# Patient Record
Sex: Female | Born: 1950 | Race: Asian | Hispanic: No | Marital: Married | Smoking: Never smoker
Health system: Southern US, Community
[De-identification: ages and names within clinical notes are randomized; demographics above are authoritative.]

## PROBLEM LIST (undated history)

## (undated) DIAGNOSIS — E785 Hyperlipidemia, unspecified: Secondary | ICD-10-CM

## (undated) DIAGNOSIS — I10 Essential (primary) hypertension: Secondary | ICD-10-CM

## (undated) DIAGNOSIS — T300 Burn of unspecified body region, unspecified degree: Secondary | ICD-10-CM

## (undated) DIAGNOSIS — K625 Hemorrhage of anus and rectum: Secondary | ICD-10-CM

## (undated) DIAGNOSIS — A048 Other specified bacterial intestinal infections: Secondary | ICD-10-CM

## (undated) HISTORY — PX: HAND SURGERY: SHX662

---

## 2002-02-17 ENCOUNTER — Encounter: Payer: Self-pay | Admitting: Emergency Medicine

## 2002-02-17 ENCOUNTER — Emergency Department (HOSPITAL_COMMUNITY): Admission: EM | Admit: 2002-02-17 | Discharge: 2002-02-17 | Payer: Self-pay

## 2002-03-22 ENCOUNTER — Emergency Department (HOSPITAL_COMMUNITY): Admission: EM | Admit: 2002-03-22 | Discharge: 2002-03-23 | Payer: Self-pay | Admitting: Emergency Medicine

## 2002-03-23 ENCOUNTER — Encounter: Payer: Self-pay | Admitting: Emergency Medicine

## 2004-11-29 ENCOUNTER — Emergency Department (HOSPITAL_COMMUNITY): Admission: EM | Admit: 2004-11-29 | Discharge: 2004-11-29 | Payer: Self-pay | Admitting: Emergency Medicine

## 2008-11-10 ENCOUNTER — Ambulatory Visit: Payer: Self-pay | Admitting: Diagnostic Radiology

## 2008-11-10 ENCOUNTER — Emergency Department (HOSPITAL_BASED_OUTPATIENT_CLINIC_OR_DEPARTMENT_OTHER): Admission: EM | Admit: 2008-11-10 | Discharge: 2008-11-10 | Payer: Self-pay | Admitting: Emergency Medicine

## 2014-08-11 ENCOUNTER — Emergency Department (HOSPITAL_COMMUNITY): Payer: Self-pay

## 2014-08-11 ENCOUNTER — Emergency Department (HOSPITAL_COMMUNITY)
Admission: EM | Admit: 2014-08-11 | Discharge: 2014-08-11 | Disposition: A | Discharge status: Home / Self Care | Payer: Self-pay | Attending: Emergency Medicine | Admitting: Emergency Medicine

## 2014-08-11 ENCOUNTER — Encounter (HOSPITAL_COMMUNITY): Payer: Self-pay | Admitting: Emergency Medicine

## 2014-08-11 DIAGNOSIS — K8020 Calculus of gallbladder without cholecystitis without obstruction: Secondary | ICD-10-CM | POA: Insufficient documentation

## 2014-08-11 DIAGNOSIS — R1013 Epigastric pain: Secondary | ICD-10-CM | POA: Insufficient documentation

## 2014-08-11 DIAGNOSIS — Z8619 Personal history of other infectious and parasitic diseases: Secondary | ICD-10-CM | POA: Insufficient documentation

## 2014-08-11 DIAGNOSIS — Z8639 Personal history of other endocrine, nutritional and metabolic disease: Secondary | ICD-10-CM | POA: Insufficient documentation

## 2014-08-11 DIAGNOSIS — Z79899 Other long term (current) drug therapy: Secondary | ICD-10-CM | POA: Insufficient documentation

## 2014-08-11 DIAGNOSIS — R6883 Chills (without fever): Secondary | ICD-10-CM | POA: Insufficient documentation

## 2014-08-11 DIAGNOSIS — R112 Nausea with vomiting, unspecified: Secondary | ICD-10-CM | POA: Insufficient documentation

## 2014-08-11 DIAGNOSIS — Z880 Allergy status to penicillin: Secondary | ICD-10-CM | POA: Insufficient documentation

## 2014-08-11 DIAGNOSIS — Z862 Personal history of diseases of the blood and blood-forming organs and certain disorders involving the immune mechanism: Secondary | ICD-10-CM | POA: Insufficient documentation

## 2014-08-11 HISTORY — DX: Hyperlipidemia, unspecified: E78.5

## 2014-08-11 HISTORY — DX: Essential (primary) hypertension: I10

## 2014-08-11 HISTORY — DX: Burn of unspecified body region, unspecified degree: T30.0

## 2014-08-11 HISTORY — DX: Hemorrhage of anus and rectum: K62.5

## 2014-08-11 HISTORY — DX: Other specified bacterial intestinal infections: A04.8

## 2014-08-11 LAB — URINALYSIS, ROUTINE W REFLEX MICROSCOPIC
BILIRUBIN URINE: NEGATIVE
Glucose, UA: NEGATIVE mg/dL
Hgb urine dipstick: NEGATIVE
Ketones, ur: NEGATIVE mg/dL
LEUKOCYTES UA: NEGATIVE
NITRITE: NEGATIVE
PH: 8 (ref 5.0–8.0)
Protein, ur: NEGATIVE mg/dL
SPECIFIC GRAVITY, URINE: 1.011 (ref 1.005–1.030)
UROBILINOGEN UA: 0.2 mg/dL (ref 0.0–1.0)

## 2014-08-11 LAB — LIPASE, BLOOD: LIPASE: 49 U/L (ref 11–59)

## 2014-08-11 LAB — CBC
HCT: 41.5 % (ref 36.0–46.0)
Hemoglobin: 14.5 g/dL (ref 12.0–15.0)
MCH: 31 pg (ref 26.0–34.0)
MCHC: 34.9 g/dL (ref 30.0–36.0)
MCV: 88.7 fL (ref 78.0–100.0)
Platelets: 251 10*3/uL (ref 150–400)
RBC: 4.68 MIL/uL (ref 3.87–5.11)
RDW: 12.6 % (ref 11.5–15.5)
WBC: 7.4 10*3/uL (ref 4.0–10.5)

## 2014-08-11 LAB — COMPREHENSIVE METABOLIC PANEL
ALBUMIN: 4.3 g/dL (ref 3.5–5.2)
ALK PHOS: 72 U/L (ref 39–117)
ALT: 18 U/L (ref 0–35)
ANION GAP: 17 — AB (ref 5–15)
AST: 17 U/L (ref 0–37)
BUN: 11 mg/dL (ref 6–23)
CALCIUM: 9.6 mg/dL (ref 8.4–10.5)
CO2: 24 mEq/L (ref 19–32)
Chloride: 96 mEq/L (ref 96–112)
Creatinine, Ser: 0.87 mg/dL (ref 0.50–1.10)
GFR calc Af Amer: 80 mL/min — ABNORMAL LOW (ref >90)
GFR calc non Af Amer: 69 mL/min — ABNORMAL LOW (ref >90)
Glucose, Bld: 108 mg/dL — ABNORMAL HIGH (ref 70–99)
POTASSIUM: 3.7 meq/L (ref 3.7–5.3)
SODIUM: 137 meq/L (ref 137–147)
TOTAL PROTEIN: 8.2 g/dL (ref 6.0–8.3)
Total Bilirubin: 0.4 mg/dL (ref 0.3–1.2)

## 2014-08-11 LAB — I-STAT TROPONIN, ED: TROPONIN I, POC: 0 ng/mL (ref 0.00–0.08)

## 2014-08-11 MED ORDER — ONDANSETRON HCL 4 MG/2ML IJ SOLN
4.0000 mg | Freq: Once | INTRAMUSCULAR | Status: AC
  Administered 2014-08-11: 4 mg via INTRAVENOUS
  Filled 2014-08-11: qty 2

## 2014-08-11 MED ORDER — HYDROCODONE-ACETAMINOPHEN 5-325 MG PO TABS: 1.0000 | ORAL_TABLET | Freq: Four times a day (QID) | ORAL | Status: DC | PRN

## 2014-08-11 MED ORDER — MORPHINE SULFATE 4 MG/ML IJ SOLN
4.0000 mg | Freq: Once | INTRAMUSCULAR | Status: AC
  Administered 2014-08-11: 4 mg via INTRAVENOUS
  Filled 2014-08-11: qty 1

## 2014-08-11 MED ORDER — IOHEXOL 300 MG/ML  SOLN
50.0000 mL | Freq: Once | INTRAMUSCULAR | Status: AC | PRN
  Administered 2014-08-11: 50 mL via ORAL

## 2014-08-11 MED ORDER — SODIUM CHLORIDE 0.9 % IV BOLUS (SEPSIS)
1000.0000 mL | Freq: Once | INTRAVENOUS | Status: AC
  Administered 2014-08-11: 1000 mL via INTRAVENOUS

## 2014-08-11 MED ORDER — IOHEXOL 300 MG/ML  SOLN
100.0000 mL | Freq: Once | INTRAMUSCULAR | Status: AC | PRN
  Administered 2014-08-11: 100 mL via INTRAVENOUS

## 2014-08-11 NOTE — ED Notes (Signed)
Bed: WA04 Expected date:  Expected time:  Means of arrival:  Comments: EMS 64yo F abd pain x 2 days / no [place]

## 2014-08-11 NOTE — ED Provider Notes (Signed)
CSN: [id number]     Arrival date & time 08/11/14  0607 History   First MD Initiated Contact with Patient 08/11/14 0701     Chief Complaint  Patient presents with  . Abdominal Pain     (Consider location/radiation/quality/duration/timing/severity/associated sxs/prior Treatment) Patient is a 63 y.o. female presenting with abdominal pain. The history is provided by the patient.  Abdominal Pain Pain location:  Epigastric, RLQ, LLQ, suprapubic, LUQ and RUQ Pain quality: aching   Pain radiates to:  Back Pain severity:  Moderate Onset quality:  Gradual Duration:  3 days Timing:  Constant Progression:  Worsening Chronicity:  Recurrent (on/off for past year) Context: recent illness (recent UTI, currently on bactrim)   Context: not eating   Associated symptoms: chills, nausea and vomiting   Associated symptoms: no chest pain, no cough, no diarrhea, no fever and no shortness of breath     Past Medical History  Diagnosis Date  . Helicobacter pylori (H. pylori)   . Hyperlipidemia   . Rectal bleed    History reviewed. No pertinent past surgical history. History reviewed. No pertinent family history. History  Substance Use Topics  . Smoking status: Never Smoker   . Smokeless tobacco: Not on file  . Alcohol Use: No   OB History   Grav Para Term Preterm Abortions TAB SAB Ect Mult Living                 Review of Systems  Constitutional: Positive for chills. Negative for fever.  Respiratory: Negative for cough and shortness of breath.   Cardiovascular: Negative for chest pain and leg swelling.  Gastrointestinal: Positive for nausea, vomiting and abdominal pain. Negative for diarrhea.  All other systems reviewed and are negative.     Allergies  Penicillins  Home Medications   Prior to Admission medications   Medication Sig Start Date End Date Taking? Authorizing Provider  ranitidine (ZANTAC) 150 MG tablet Take 150 mg by mouth 2 (two) times daily.   Yes Historical Provider,  MD  sulfamethoxazole-trimethoprim (BACTRIM DS) 800-160 MG per tablet Take 1 tablet by mouth 2 (two) times daily.   Yes Historical Provider, MD   BP 153/84  Pulse 77  Temp(Src) 97.8 F (36.6 C) (Oral)  Resp 18  SpO2 100% Physical Exam  Nursing note and vitals reviewed. Constitutional: She is oriented to person, place, and time. She appears well-developed and well-nourished. No distress.  HENT:  Head: Normocephalic and atraumatic.  Mouth/Throat: Oropharynx is clear and moist.  Eyes: EOM are normal. Pupils are equal, round, and reactive to light.  Neck: Normal range of motion. Neck supple.  Cardiovascular: Normal rate and regular rhythm.  Exam reveals no friction rub.   No murmur heard. Pulmonary/Chest: Effort normal and breath sounds normal. No respiratory distress. She has no wheezes. She has no rales.  Abdominal: Soft. She exhibits no distension. There is tenderness (upper both left and right quadrants, lower - mostly suprapubic, but also in both RLQ and LLQ). There is no rebound and no guarding.  Musculoskeletal: Normal range of motion. She exhibits no edema.  Neurological: She is alert and oriented to person, place, and time. No cranial nerve deficit. She exhibits normal muscle tone. Coordination normal.  Skin: No rash noted. She is not diaphoretic.    ED Course  Procedures (including critical care time) Labs Review Labs Reviewed  CBC  COMPREHENSIVE METABOLIC PANEL  URINALYSIS, ROUTINE W REFLEX MICROSCOPIC  LIPASE, BLOOD  I-STAT TROPOININ, ED    Imaging Review Ct  Abdomen Pelvis W Contrast  08/11/2014   CLINICAL DATA:  63 year old female with abdominal and back pain, reportedly diagnosed with bladder inflammation at urgent care. Initial encounter.  EXAM: CT ABDOMEN AND PELVIS WITH CONTRAST  TECHNIQUE: Multidetector CT imaging of the abdomen and pelvis was performed using the standard protocol following bolus administration of intravenous contrast.  CONTRAST:  OMNIPAQUE  IOHEXOL 300 MG/ML SOLN, 50mL OMNIPAQUE IOHEXOL 300 MG/ML SOLN  COMPARISON:  Report of CT Abdomen and Pelvis 03/23/2002 (no images available).  FINDINGS: No pericardial or pleural effusion. Evidence of coronary artery calcified atherosclerosis. Mild atelectasis at both lung bases.  Mild for age degenerative changes in the spine. No acute osseous abnormality identified.  No pelvic free fluid. Redundant but otherwise negative distal colon. Negative uterus and adnexa.  Retained stool intermittently throughout the colon. The transverse colon and both flexures also or mildly redundant. Oral contrast has reached the ascending colon. Normal cecum. Elongated but normal retrocecal appendix (coronal image 87). Normal terminal ileum. No dilated or inflamed small bowel. Decompressed stomach and duodenum.  Cholelithiasis (series 2, image 24). No pericholecystic inflammation. Mildly decreased density throughout the liver in keeping with steatosis. Negative spleen, pancreas and adrenal glands. Normal portal venous system. No abdominal free fluid. Major arterial structures in the abdomen and pelvis are patent. Aortoiliac calcified atherosclerosis noted.  Multifocal left renal cortical scarring and dilated calices, described in 2003. Occasional dystrophic calcification of the left kidney. No left hydronephrosis or hydroureter. No periureteral stranding.  Unremarkable bladder.  Negative right kidney and ureter.  Symmetric renal enhancement and contrast excretion.  No lymphadenopathy.  IMPRESSION: 1. No acute or inflammatory process identified in the abdomen or pelvis. 2. 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 3. Cholelithiasis.   Electronically Signed   By: Augusto Gamble M.D.   On: 08/11/2014 12:54   US Abdomen Limited Ruq  08/11/2014   CLINICAL DATA:  Right upper quadrant pain ; gallbladder issues.  EXAM: US ABDOMEN LIMITED - RIGHT UPPER QUADRANT  COMPARISON:  None.  FINDINGS:  Gallbladder:  There are tiny mobile echogenic stones versus sludge. There is no gallbladder wall thickening, pericholecystic fluid, or positive sonographic Murphy's sign.  Common bile duct:  Diameter: 3 mm  Liver:  The liver exhibits no focal mass nor ductal dilation. Its echotexture is within the limits of normal.  IMPRESSION: There tiny gallstones versus sludge without evidence of acute cholecystitis. There is no acute abnormality of the liver or common bile duct.   Electronically Signed   By: David  [place]   On: 08/11/2014 10:37     EKG Interpretation   Date/Time:  Tuesday August 11 2014 07:46:04 EDT Ventricular Rate:  71 PR Interval:  157 QRS Duration: 96 QT Interval:  404 QTC Calculation: 439 R Axis:   -24 Text Interpretation:  Sinus rhythm Borderline left axis deviation RSR' in  V1 or V2, probably normal variant No prior for comparison Confirmed by  [facility]  MD, Brandt Chaney ([id number]) on 08/11/2014 11:23:37 AM      MDM   Final diagnoses:  Calculus of gallbladder without cholecystitis without obstruction    61F with hx of GB issues and UTI presents with upper abdominal pain, vomiting for past 3 days. Associated nausea, chills. No diarrhea. Son states she passed out when she was feeling bad, unable to elaborate much further. Patient here, doing well, awake and speaking normally. Currently on bactrim for UTI vs pyelo - she was told she  had a kidney infection. AFVSS here. Diffuse upper abdominal pain and diffuse lower abdominal pain. No rebound or guarding. Will start with RUQ [place] since hx of GB pathology, low threshold to scan. Will also do EKG since she had syncope. [place] results - digital reporting is down - read as small gallstones with layering sludge. No findings c/w acute cholecystitis, normal CBD, normal liver. Will CT scan her abdomen due to diffuse pain. CT scan ok. Given general surgery f/u for her cholelithiasis. Stable for discharge.   Elwin Mocha, MD 08/12/14 [phone]

## 2014-08-11 NOTE — ED Notes (Signed)
Pt son is at bedside states [facility] chole[facility]stit[facility]. Pt was given sulfa antibiotics with one pill remaining from regimen. Pt also dx with UTI.

## 2014-08-11 NOTE — ED Notes (Signed)
Per EMS pt states yesterday she went to urgent care told pt had possible inflammation of the bladder. Pt is unable to speak [place], fire dept spoke with son who translated. Pt having abdominal and back pain. Pt is alert and oriented.

## 2014-08-11 NOTE — Discharge Instructions (Signed)
Cholelithiasis Cholelithiasis (also called gallstones) is a form of gallbladder disease in which gallstones form in your gallbladder. The gallbladder is an organ that stores bile made in the liver, which helps digest fats. Gallstones begin as small crystals and slowly grow into stones. Gallstone pain occurs when the gallbladder spasms and a gallstone is blocking the duct. Pain can also occur when a stone passes out of the duct.  RISK FACTORS  Being female.   Having multiple pregnancies. Health care providers sometimes advise removing diseased gallbladders before future pregnancies.   Being obese.  Eating a diet heavy in fried foods and fat.   Being older than 72 years and increasing age.   Prolonged use of medicines containing female hormones.   Having diabetes mellitus.   Rapidly losing weight.   Having a family history of gallstones (heredity).  SYMPTOMS  Nausea.   Vomiting.  Abdominal pain.   Yellowing of the skin (jaundice).   Sudden pain. It may persist from several minutes to several hours.  Fever.   Tenderness to the touch. In some cases, when gallstones do not move into the bile duct, people have no pain or symptoms. These are called "silent" gallstones.  TREATMENT Silent gallstones do not need treatment. In severe cases, emergency surgery may be required. Options for treatment include:  Surgery to remove the gallbladder. This is the most common treatment.  Medicines. These do not always work and may take 6-12 months or more to work.  Shock wave treatment (extracorporeal biliary lithotripsy). In this treatment an ultrasound machine sends shock waves to the gallbladder to break gallstones into smaller pieces that can pass into the intestines or be dissolved by medicine. HOME CARE INSTRUCTIONS   Only take over-the-counter or prescription medicines for pain, discomfort, or fever as directed by your health care provider.   Follow a low-fat diet until  seen again by your health care provider. Fat causes the gallbladder to contract, which can result in pain.   Follow up with your health care provider as directed. Attacks are almost always recurrent and surgery is usually required for permanent treatment.  SEEK IMMEDIATE MEDICAL CARE IF:   Your pain increases and is not controlled by medicines.   You have a fever or persistent symptoms for more than 2-3 days.   You have a fever and your symptoms suddenly get worse.   You have persistent nausea and vomiting.  MAKE SURE YOU:   Understand these instructions.  Will watch your condition.  Will get help right away if you are not doing well or get worse. Document Released: 10/26/2005 Document Revised: 07/02/2013 Document Reviewed: 04/23/2013 [facility] Patient Information 2015 [place], [place]. This information is not intended to replace advice given to you by your health care provider. Make sure you discuss any questions you have with your health care provider.   Emergency Department Resource Guide 1) Find a Doctor and Pay Out of Pocket Although you won't have to find out who is covered by your insurance plan, it is a good idea to ask around and get recommendations. You will then need to call the office and see if the doctor you have chosen will accept you as a new patient and what types of options they offer for patients who are self-pay. Some doctors offer discounts or will set up payment plans for their patients who do not have insurance, but you will need to ask so you aren't surprised when you get to your appointment.  2) Contact Your Local  Health Department Not all health departments have doctors that can see patients for sick visits, but many do, so it is worth a call to see if yours does. If you don't know where your local health department is, you can check in your phone book. The CDC also has a tool to help you locate your state's health department, and many state websites also  have listings of all of their local health departments.  3) Find a [facility] If your illness is not likely to be very severe or complicated, you may want to try a walk in clinic. These are popping up all over the country in pharmacies, drugstores, and shopping centers. They're usually staffed by nurse practitioners or physician assistants that have been trained to treat common illnesses and complaints. They're usually fairly quick and inexpensive. However, if you have serious medical issues or chronic medical problems, these are probably not your best option.  No Primary Care Doctor: - Call Health Connect at  [phone] - they can help you locate a primary care doctor that  accepts your insurance, provides certain services, etc. - Physician Referral Service- [phone]  Chronic Pain Problems: Organization         Address  Phone   Notes  [facility]  [phone] Patients need to be referred by their primary care doctor.   Medication Assistance: Organization         Address  Phone   Notes  [facility] Medication [facility] [address] [place], [place] [address] [phone] --Must be a resident of [facility] -- Must have NO insurance coverage whatsoever (no Medicaid/ Medicare, etc.) -- The pt. MUST have a primary care doctor that directs their care regularly and follows them in the community   MedAssist  [phone]   [employer]  [phone]    Agencies that provide inexpensive medical care: Organization         Address  Phone   Notes  [place]  [phone]   [facility]    [phone]   [facility] [place], [place] [address] [phone]   [place] [address] [place] [phone]   Planned Parenthood    [phone]   [facility]    [phone]   [place] and [place] [address], [place] Phone:  [phone], Fax:  [phone] Hours of Operation:  9 am - 6 pm, M-F.  Also accepts Medicaid/Medicare and self-pay.  [facility] for [place] [address] [place], [address], [place] Phone: [phone], Fax: [phone]. Hours of Operation:  8:30 am - 5:30 pm, M-F.  Also accepts Medicaid and self-pay.  [facility] [place] [address], [place] Phone: [phone]   [place], [place], [place] [phone], Ext. [phone] Mondays & Thursdays: 7-9 AM.  First 15 patients are seen on a first come, first serve basis.    [place] Providers:  Organization         Address  Phone   Notes  [facility] [address], [place] [phone] Also accepts self-pay patients.  [place], [place]  [phone]   [place], [place], [place] [phone]  [place] [address] [place] [phone]   Lucianne Lei [address], [place]   [phone] Only accepts [place] Access [place] patients after they have their name applied to their card.   Self-Pay (no insurance) in [facility]:  Organization         Address  Phone   Notes  Sickle Cell Patients, [facility] [place] [phone]   [facility] [place] [phone]   [facility] [place]  [place], [place], [place] [phone]   [facility]/Dr. Osei-Bonsu  [address], [place] or [address], [place] [phone] Phone number for both [place] and [place] locations is the same.  Urgent Medical and [facility] [address] [place] [phone]   [facility] [address],  [place] or [address] [phone]   [facility] [address], [place] [phone], phone; [phone], fax Sees patients 1st and 3rd Saturday of every month.  Must not qualify for public or private insurance (i.e. Medicaid, Medicare, [place] Health Choice, Veterans' Benefits)  Household income should be no more than 200% of the poverty level The clinic cannot treat you if you are pregnant or think you are pregnant  Sexually transmitted diseases are not treated at the clinic.    Dental Care: Organization         Address  Phone  Notes  [facility] [place] [phone] Accepts children up to age 14 who are enrolled in [place] or [place]; pregnant women with a Medicaid card; and children who have applied for Medicaid or [place] Health Choice, but were declined, whose parents can pay a reduced fee at time of service.  [facility] of [facility]  [address], [place] [phone] Accepts children up to age 61 who are enrolled in [place] or [place]; pregnant women with a Medicaid card; and children who have applied for Medicaid or [place] Health Choice, but were declined, whose parents can pay a reduced fee at time of service.  [place] Adult Dental Access PROGRAM  [place] [phone] Patients are seen by appointment only. Walk-ins are not accepted. [place] will see patients 20 years of age and older. Monday - Tuesday (8am-5pm) Most Wednesdays (8:30-5pm) $30 per visit, cash only  [facility] Adult Dental Access PROGRAM  [address], [facility] [phone] Patients are seen by appointment only. Walk-ins are not accepted. [place] will see patients 61 years of age and older. One Wednesday Evening (Monthly: Volunteer Based).  $30 per visit, cash only  [place]  [phone] for adults; Children under age 8, call Graduate Pediatric Dentistry at [phone]. Children aged 28-14, please call [phone] to request a pediatric application.  Dental services are provided in all areas of dental care including fillings, crowns and bridges, complete and partial dentures, implants, gum treatment, root canals, and extractions. Preventive care is also provided. Treatment is provided to both adults and children. Patients are selected via a lottery and there is often a waiting list.   [facility] [address] [place]  [phone] [email or web address]  [facility] [address] [place], [place] [phone], Ext. [phone] Second and Fourth Thursday of each month, opens at 6:30 AM; Clinic ends at 9 AM.  Patients are seen on a first-come first-served basis, and a limited number are seen during each clinic.   [facility]  [address] Hillard Danker [place], [place] [phone]   Eligibility Requirements You must have lived in [place], [place], or [place] counties for at least the last three months.   You cannot be eligible for state or federal sponsored [employer], including [employer], [place], or [employer].   You generally cannot be eligible for healthcare insurance through your employer.    How to apply: Eligibility screenings are held every Tuesday and Wednesday afternoon from 1:00 pm until 4:00 pm. You do not need an appointment for the interview!  [facility] [address] [place], [place]   [place]  [place] Department  [place]  [phone]    Behavioral Health Resources in the Community: Intensive Outpatient Programs Organization         Address  Phone  Notes  [place]. [address] [place], [place] [phone]     [facility] Outpatient [address] [place], [place]   ADS: Alcohol & Drug Svcs [address] [place], [place]   [place] [address],  [place], [place] or [phone]   Substance Abuse Resources Organization         Address  Phone  Notes  Alcohol and Drug Services  [phone]   [place]  [phone]   [facility] [place]   Chinita Pester  [phone]   Residential & Outpatient Substance Abuse Program  [phone]   Psychological Services Organization         Address  Phone  Notes  [facility] [place]  [place]  [phone]   [place] [address] [place] or [phone]    Mobile Crisis Teams Organization         Address  Phone  Notes  [facility], Mobile Crisis Care Unit  [phone]   Assertive Psychotherapeutic Services  [address] [place], [place]   [place] Levels [address] [place] [phone]    Self-Help/Support Groups Organization         Address  Phone             Notes  [place]. of [place] - variety of support groups  [place] Call for more information  Narcotics Anonymous (NA), [facility] [address], [employer] [place]  2 meetings at this location   Special educational needs teacher         Address  Phone  Notes  [facility] [place],    [place]  [phone]   [place]  [address] [place] [phone], [place], [place]   [place], [place] [phone] Admissions: 8am-3pm M-F  Incentives Substance [place] [address],    [place], [place] [phone]   [facility] [address] Jadene Pierini [place], [place]   [employer] [facility] [address]  [place],  [place]   [facility] Programs - Intensive Outpatient [phone]  [address], [place], [place]   [facility] ([place] [address].,  [place], [place] [phone] or [phone]   [facility] Services (RTS) [address] [place], [place] Accepts Medicaid  [facility] [place] [address],  [place] [phone] Substance Abuse/Addiction Treatment   [facility] Organization         Address  Phone  Notes  [facility]  [phone]   Domenic Schwab, PhD [address] Arlis Porta [place], [place]   [phone] or [phone]   [place], [place] [phone]   [place] [address], [place], [place] [phone] Insurance/Medicaid/sponsorship through [facility] and Families [address], [address]                                    [place], [place] [phone] [place] [address] [place], [place] [phone]    Dr. Adele Schilder  [phone]   [facility] Dept. 1) [address] [place] 2) [place] 3)  [place] [address], [place] [phone]  [phone]   [place] [phone] or [phone] (After Hours)

## 2014-08-11 NOTE — ED Notes (Signed)
MD at bedside with interpreter phone.

## 2016-05-29 ENCOUNTER — Emergency Department (HOSPITAL_COMMUNITY): Payer: Medicare Other

## 2016-05-29 ENCOUNTER — Encounter (HOSPITAL_COMMUNITY): Payer: Self-pay | Admitting: Emergency Medicine

## 2016-05-29 ENCOUNTER — Emergency Department (HOSPITAL_COMMUNITY)
Admission: EM | Admit: 2016-05-29 | Discharge: 2016-05-29 | Disposition: A | Discharge status: Home / Self Care | Payer: Medicare Other | Attending: Emergency Medicine | Admitting: Emergency Medicine

## 2016-05-29 DIAGNOSIS — Z79899 Other long term (current) drug therapy: Secondary | ICD-10-CM | POA: Diagnosis not present

## 2016-05-29 DIAGNOSIS — R079 Chest pain, unspecified: Secondary | ICD-10-CM | POA: Diagnosis present

## 2016-05-29 DIAGNOSIS — I10 Essential (primary) hypertension: Secondary | ICD-10-CM | POA: Diagnosis not present

## 2016-05-29 DIAGNOSIS — R51 Headache: Secondary | ICD-10-CM | POA: Insufficient documentation

## 2016-05-29 DIAGNOSIS — R519 Headache, unspecified: Secondary | ICD-10-CM

## 2016-05-29 LAB — URINALYSIS, ROUTINE W REFLEX MICROSCOPIC
Bilirubin Urine: NEGATIVE
Glucose, UA: NEGATIVE mg/dL
Hgb urine dipstick: NEGATIVE
Ketones, ur: NEGATIVE mg/dL
Leukocytes, UA: NEGATIVE
Nitrite: NEGATIVE
Protein, ur: NEGATIVE mg/dL
Specific Gravity, Urine: 1.022 (ref 1.005–1.030)
pH: 7.5 (ref 5.0–8.0)

## 2016-05-29 LAB — BASIC METABOLIC PANEL
Anion gap: 8 (ref 5–15)
BUN: 13 mg/dL (ref 6–20)
CO2: 25 mmol/L (ref 22–32)
Calcium: 9.3 mg/dL (ref 8.9–10.3)
Chloride: 104 mmol/L (ref 101–111)
Creatinine, Ser: 0.77 mg/dL (ref 0.44–1.00)
GFR calc Af Amer: >60 mL/min (ref >60)
GFR calc non Af Amer: >60 mL/min (ref >60)
Glucose, Bld: 133 mg/dL — ABNORMAL HIGH (ref 65–99)
Potassium: 3.1 mmol/L — ABNORMAL LOW (ref 3.5–5.1)
Sodium: 137 mmol/L (ref 135–145)

## 2016-05-29 LAB — I-STAT TROPONIN, ED: Troponin i, poc: 0 ng/mL (ref 0.00–0.08)

## 2016-05-29 LAB — CBC
HCT: 42.2 % (ref 36.0–46.0)
Hemoglobin: 14.1 g/dL (ref 12.0–15.0)
MCH: 30.6 pg (ref 26.0–34.0)
MCHC: 33.4 g/dL (ref 30.0–36.0)
MCV: 91.5 fL (ref 78.0–100.0)
Platelets: 256 10*3/uL (ref 150–400)
RBC: 4.61 MIL/uL (ref 3.87–5.11)
RDW: 12.8 % (ref 11.5–15.5)
WBC: 5.3 10*3/uL (ref 4.0–10.5)

## 2016-05-29 MED ORDER — POTASSIUM CHLORIDE CRYS ER 20 MEQ PO TBCR
40.0000 meq | EXTENDED_RELEASE_TABLET | Freq: Once | ORAL | Status: AC
  Administered 2016-05-29: 40 meq via ORAL
  Filled 2016-05-29: qty 2

## 2016-05-29 NOTE — ED Notes (Signed)
Patient has chest pain for a year. Today she states that it is chest pain with her feeling like she is hungry. Patient states that she came from doctor office with abnormal EKG. Patient has a copy of EKG.

## 2016-05-29 NOTE — Discharge Instructions (Signed)
Nonspecific Chest Pain  °Chest pain can be caused by many different conditions. There is always a chance that your pain could be related to something serious, such as a heart attack or a blood clot in your lungs. Chest pain can also be caused by conditions that are not life-threatening. If you have chest pain, it is very important to follow up with your health care provider. °CAUSES  °Chest pain can be caused by: °· Heartburn. °· Pneumonia or bronchitis. °· Anxiety or stress. °· Inflammation around your heart (pericarditis) or lung (pleuritis or pleurisy). °· A blood clot in your lung. °· A collapsed lung (pneumothorax). It can develop suddenly on its own (spontaneous pneumothorax) or from trauma to the chest. °· Shingles infection (varicella-zoster virus). °· Heart attack. °· Damage to the bones, muscles, and cartilage that make up your chest wall. This can include: °¨ Bruised bones due to injury. °¨ Strained muscles or cartilage due to frequent or repeated coughing or overwork. °¨ Fracture to one or more ribs. °¨ Sore cartilage due to inflammation (costochondritis). °RISK FACTORS  °Risk factors for chest pain may include: °· Activities that increase your risk for trauma or injury to your chest. °· Respiratory infections or conditions that cause frequent coughing. °· Medical conditions or overeating that can cause heartburn. °· Heart disease or family history of heart disease. °· Conditions or health behaviors that increase your risk of developing a blood clot. °· Having had chicken pox (varicella zoster). °SIGNS AND SYMPTOMS °Chest pain can feel like: °· Burning or tingling on the surface of your chest or deep in your chest. °· Crushing, pressure, aching, or squeezing pain. °· Dull or sharp pain that is worse when you move, cough, or take a deep breath. °· Pain that is also felt in your back, neck, shoulder, or arm, or pain that spreads to any of these areas. °Your chest pain may come and go, or it may stay  constant. °DIAGNOSIS °Lab tests or other studies may be needed to find the cause of your pain. Your health care provider may have you take a test called an ambulatory ECG (electrocardiogram). An ECG records your heartbeat patterns at the time the test is performed. You may also have other tests, such as: °· Transthoracic echocardiogram (TTE). During echocardiography, sound waves are used to create a picture of all of the heart structures and to look at how blood flows through your heart. °· Transesophageal echocardiogram (TEE). This is a more advanced imaging test that obtains images from inside your body. It allows your health care provider to see your heart in finer detail. °· Cardiac monitoring. This allows your health care provider to monitor your heart rate and rhythm in real time. °· Holter monitor. This is a portable device that records your heartbeat and can help to diagnose abnormal heartbeats. It allows your health care provider to track your heart activity for several days, if needed. °· Stress tests. These can be done through exercise or by taking medicine that makes your heart beat more quickly. °· Blood tests. °· Imaging tests. °TREATMENT  °Your treatment depends on what is causing your chest pain. Treatment may include: °· Medicines. These may include: °¨ Acid blockers for heartburn. °¨ Anti-inflammatory medicine. °¨ Pain medicine for inflammatory conditions. °¨ Antibiotic medicine, if an infection is present. °¨ Medicines to dissolve blood clots. °¨ Medicines to treat coronary artery disease. °· Supportive care for conditions that do not require medicines. This may include: °¨ Resting. °¨ Applying heat   or cold packs to injured areas. °¨ Limiting activities until pain decreases. °HOME CARE INSTRUCTIONS °· If you were prescribed an antibiotic medicine, finish it all even if you start to feel better. °· Avoid any activities that bring on chest pain. °· Do not use any tobacco products, including  cigarettes, chewing tobacco, or electronic cigarettes. If you need help quitting, ask your health care provider. °· Do not drink alcohol. °· Take medicines only as directed by your health care provider. °· Keep all follow-up visits as directed by your health care provider. This is important. This includes any further testing if your chest pain does not go away. °· If heartburn is the cause for your chest pain, you may be told to keep your head raised (elevated) while sleeping. This reduces the chance that acid will go from your stomach into your esophagus. °· Make lifestyle changes as directed by your health care provider. These may include: °¨ Getting regular exercise. Ask your health care provider to suggest some activities that are safe for you. °¨ Eating a heart-healthy diet. A registered dietitian can help you to learn healthy eating options. °¨ Maintaining a healthy weight. °¨ Managing diabetes, if necessary. °¨ Reducing stress. °SEEK MEDICAL CARE IF: °· Your chest pain does not go away after treatment. °· You have a rash with blisters on your chest. °· You have a fever. °SEEK IMMEDIATE MEDICAL CARE IF:  °· Your chest pain is worse. °· You have an increasing cough, or you cough up blood. °· You have severe abdominal pain. °· You have severe weakness. °· You faint. °· You have chills. °· You have sudden, unexplained chest discomfort. °· You have sudden, unexplained discomfort in your arms, back, neck, or jaw. °· You have shortness of breath at any time. °· You suddenly start to sweat, or your skin gets clammy. °· You feel nauseous or you vomit. °· You suddenly feel light-headed or dizzy. °· Your heart begins to beat quickly, or it feels like it is skipping beats. °These symptoms may represent a serious problem that is an emergency. Do not wait to see if the symptoms will go away. Get medical help right away. Call your local emergency services (911 in the [place] Do not drive yourself to the hospital. °  °This  information is not intended to replace advice given to you by your health care provider. Make sure you discuss any questions you have with your health care provider. °  °Document Released: 08/09/2005 Document Revised: 11/20/2014 Document Reviewed: 06/05/2014 °Elsevier Interactive Patient Education ©2016 [employer] ° °

## 2016-05-29 NOTE — ED Notes (Signed)
Delay in patient EKG due to patient being in the bathroom.

## 2016-05-29 NOTE — ED Notes (Signed)
Patient transported to CT 
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
## 2016-10-27 ENCOUNTER — Other Ambulatory Visit: Payer: Self-pay | Admitting: *Deleted

## 2016-10-27 DIAGNOSIS — R5381 Other malaise: Secondary | ICD-10-CM

## 2016-10-27 DIAGNOSIS — Z1231 Encounter for screening mammogram for malignant neoplasm of breast: Secondary | ICD-10-CM

## 2020-01-31 ENCOUNTER — Emergency Department (HOSPITAL_COMMUNITY)
Admission: EM | Admit: 2020-01-31 | Discharge: 2020-01-31 | Disposition: A | Discharge status: Home / Self Care | Payer: Medicare Other | Attending: Emergency Medicine | Admitting: Emergency Medicine

## 2020-01-31 ENCOUNTER — Other Ambulatory Visit: Payer: Self-pay

## 2020-01-31 ENCOUNTER — Emergency Department (HOSPITAL_COMMUNITY): Payer: Medicare Other

## 2020-01-31 DIAGNOSIS — R140 Abdominal distension (gaseous): Secondary | ICD-10-CM | POA: Insufficient documentation

## 2020-01-31 DIAGNOSIS — I10 Essential (primary) hypertension: Secondary | ICD-10-CM | POA: Insufficient documentation

## 2020-01-31 DIAGNOSIS — R82998 Other abnormal findings in urine: Secondary | ICD-10-CM | POA: Insufficient documentation

## 2020-01-31 DIAGNOSIS — R3912 Poor urinary stream: Secondary | ICD-10-CM | POA: Insufficient documentation

## 2020-01-31 DIAGNOSIS — M791 Myalgia, unspecified site: Secondary | ICD-10-CM | POA: Diagnosis not present

## 2020-01-31 DIAGNOSIS — Z79899 Other long term (current) drug therapy: Secondary | ICD-10-CM | POA: Insufficient documentation

## 2020-01-31 DIAGNOSIS — R079 Chest pain, unspecified: Secondary | ICD-10-CM | POA: Diagnosis not present

## 2020-01-31 DIAGNOSIS — R531 Weakness: Secondary | ICD-10-CM | POA: Diagnosis not present

## 2020-01-31 DIAGNOSIS — R109 Unspecified abdominal pain: Secondary | ICD-10-CM | POA: Diagnosis not present

## 2020-01-31 DIAGNOSIS — R519 Headache, unspecified: Secondary | ICD-10-CM | POA: Insufficient documentation

## 2020-01-31 DIAGNOSIS — R6883 Chills (without fever): Secondary | ICD-10-CM | POA: Insufficient documentation

## 2020-01-31 DIAGNOSIS — R42 Dizziness and giddiness: Secondary | ICD-10-CM | POA: Diagnosis not present

## 2020-01-31 LAB — COMPREHENSIVE METABOLIC PANEL
ALT: 24 U/L (ref 0–44)
AST: 27 U/L (ref 15–41)
Albumin: 4.2 g/dL (ref 3.5–5.0)
Alkaline Phosphatase: 73 U/L (ref 38–126)
Anion gap: 7 (ref 5–15)
BUN: 10 mg/dL (ref 8–23)
CO2: 24 mmol/L (ref 22–32)
Calcium: 8.8 mg/dL — ABNORMAL LOW (ref 8.9–10.3)
Chloride: 106 mmol/L (ref 98–111)
Creatinine, Ser: 0.61 mg/dL (ref 0.44–1.00)
GFR calc Af Amer: >60 mL/min (ref >60)
GFR calc non Af Amer: >60 mL/min (ref >60)
Glucose, Bld: 113 mg/dL — ABNORMAL HIGH (ref 70–99)
Potassium: 3.8 mmol/L (ref 3.5–5.1)
Sodium: 137 mmol/L (ref 135–145)
Total Bilirubin: 0.8 mg/dL (ref 0.3–1.2)
Total Protein: 7.2 g/dL (ref 6.5–8.1)

## 2020-01-31 LAB — URINALYSIS, ROUTINE W REFLEX MICROSCOPIC
Bacteria, UA: NONE SEEN
Bilirubin Urine: NEGATIVE
Glucose, UA: NEGATIVE mg/dL
Ketones, ur: NEGATIVE mg/dL
Leukocytes,Ua: NEGATIVE
Nitrite: NEGATIVE
Protein, ur: NEGATIVE mg/dL
Specific Gravity, Urine: 1.005 (ref 1.005–1.030)
pH: 9 — ABNORMAL HIGH (ref 5.0–8.0)

## 2020-01-31 LAB — CBC WITH DIFFERENTIAL/PLATELET
Abs Immature Granulocytes: 0.02 10*3/uL (ref 0.00–0.07)
Basophils Absolute: 0 10*3/uL (ref 0.0–0.1)
Basophils Relative: 0 %
Eosinophils Absolute: 0 10*3/uL (ref 0.0–0.5)
Eosinophils Relative: 0 %
HCT: 41.4 % (ref 36.0–46.0)
Hemoglobin: 13.5 g/dL (ref 12.0–15.0)
Immature Granulocytes: 0 %
Lymphocytes Relative: 17 %
Lymphs Abs: 1.2 10*3/uL (ref 0.7–4.0)
MCH: 30.9 pg (ref 26.0–34.0)
MCHC: 32.6 g/dL (ref 30.0–36.0)
MCV: 94.7 fL (ref 80.0–100.0)
Monocytes Absolute: 0.2 10*3/uL (ref 0.1–1.0)
Monocytes Relative: 3 %
Neutro Abs: 5.5 10*3/uL (ref 1.7–7.7)
Neutrophils Relative %: 80 %
Platelets: 225 10*3/uL (ref 150–400)
RBC: 4.37 MIL/uL (ref 3.87–5.11)
RDW: 12.7 % (ref 11.5–15.5)
WBC: 6.9 10*3/uL (ref 4.0–10.5)
nRBC: 0 % (ref 0.0–0.2)

## 2020-01-31 LAB — TROPONIN I (HIGH SENSITIVITY)
Troponin I (High Sensitivity): 6 ng/L (ref <18)
Troponin I (High Sensitivity): 6 ng/L (ref <18)

## 2020-01-31 LAB — TSH: TSH: 0.565 u[IU]/mL (ref 0.350–4.500)

## 2020-01-31 LAB — MAGNESIUM: Magnesium: 2.1 mg/dL (ref 1.7–2.4)

## 2020-01-31 MED ORDER — SODIUM CHLORIDE 0.9 % IV BOLUS
500.0000 mL | Freq: Once | INTRAVENOUS | Status: AC
  Administered 2020-01-31: 500 mL via INTRAVENOUS

## 2020-01-31 MED ORDER — MECLIZINE HCL 25 MG PO TABS
25.0000 mg | ORAL_TABLET | Freq: Once | ORAL | Status: AC
  Administered 2020-01-31: 25 mg via ORAL
  Filled 2020-01-31: qty 1

## 2020-01-31 MED ORDER — MECLIZINE HCL 12.5 MG PO TABS: 12.5000 mg | ORAL_TABLET | Freq: Three times a day (TID) | ORAL | 0 refills | Status: DC | PRN

## 2020-01-31 NOTE — ED Notes (Addendum)
[place] interpreter used: [place] Jee # [phone]  Per pt- I woke up this AM going to [facility], suddenly got dizzy. Drank some water, waited. But it got worse an hour later.  I started feeling nausea and a headache. My belly got bloated, I started having chest pain. I was having belly pain and whole body aches, low energy. I was having chills too by the time I got here.   Dizziness is worse with movement, when I get up.   Pt reports chest pain has resolved by time she arrived to ED.

## 2020-01-31 NOTE — ED Notes (Signed)
Pt ambulated to bathroom, one person assistance.  Pt with staggered gait to bathroom, c/o dizziness while walking.

## 2020-01-31 NOTE — Discharge Instructions (Signed)
Continue taking home medications as prescribed. Follow-up with your primary care doctor for reevaluation. Make sure you are staying well-hydrated water. Return to the emergency room with any new, worsening, concerning symptoms.
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
## 2020-01-31 NOTE — ED Provider Notes (Signed)
  Face-to-face evaluation   History: Patient with prior history of dizziness treated with meclizine, had dizziness today, took meclizine and did not get relief so came here.  Since that time dizziness is resolved.  I evaluated patient at 1:55 PM.  Physical exam: Alert, calm cooperative.  No dysarthria or aphasia.  No nystagmus.  Normal finger-to-nose and heel-to-shin, bilaterally.  Negative test of skew.  Negative head impulse testing.  Medical screening examination/treatment/procedure(s) were conducted as a shared visit with non-physician practitioner(s) and myself.  I personally evaluated the patient during the encounter    Mancel Bale, MD 01/31/20 2236

## 2020-01-31 NOTE — ED Triage Notes (Signed)
Pt BIBA from home,   Per EMS- Pt c/o nausea, gen weakness,- started today  Similar incident happened "months ago" pt prescribed meclizine.   hx of vertigo  BP 140/70 HR 60 SpO2 100 CBG 125 R 18  22 L wrist- 4 mg zofran

## 2020-02-02 LAB — URINE CULTURE: Culture: NO GROWTH

## 2020-10-15 ENCOUNTER — Other Ambulatory Visit: Payer: Self-pay

## 2020-10-15 ENCOUNTER — Encounter (HOSPITAL_COMMUNITY): Payer: Self-pay

## 2020-10-15 ENCOUNTER — Emergency Department (HOSPITAL_COMMUNITY)
Admission: EM | Admit: 2020-10-15 | Discharge: 2020-10-15 | Disposition: A | Discharge status: Home / Self Care | Payer: Medicare Other | Attending: Emergency Medicine | Admitting: Emergency Medicine

## 2020-10-15 DIAGNOSIS — R220 Localized swelling, mass and lump, head: Secondary | ICD-10-CM | POA: Diagnosis present

## 2020-10-15 DIAGNOSIS — R300 Dysuria: Secondary | ICD-10-CM | POA: Insufficient documentation

## 2020-10-15 DIAGNOSIS — M549 Dorsalgia, unspecified: Secondary | ICD-10-CM | POA: Diagnosis not present

## 2020-10-15 DIAGNOSIS — R519 Headache, unspecified: Secondary | ICD-10-CM | POA: Insufficient documentation

## 2020-10-15 DIAGNOSIS — Z5321 Procedure and treatment not carried out due to patient leaving prior to being seen by health care provider: Secondary | ICD-10-CM | POA: Insufficient documentation

## 2020-10-15 NOTE — ED Triage Notes (Signed)
Patient c/o having facial swelling and itching x 1 week. Patient states some days she has woken up with swelling in her eyes. paitent denies any breathing or swallowing issues. Patient states her head feels tight.   Patient added at the end of triage that she has been having problems with urinating  X 1 week. Patient stated, frequency and voiding small amounts.

## 2021-12-03 ENCOUNTER — Other Ambulatory Visit: Payer: Self-pay

## 2021-12-03 ENCOUNTER — Emergency Department (HOSPITAL_COMMUNITY): Payer: Medicare Other

## 2021-12-03 ENCOUNTER — Encounter (HOSPITAL_COMMUNITY): Payer: Self-pay

## 2021-12-03 ENCOUNTER — Emergency Department (HOSPITAL_COMMUNITY)
Admission: EM | Admit: 2021-12-03 | Discharge: 2021-12-03 | Disposition: A | Discharge status: Home / Self Care | Payer: Medicare Other | Attending: Emergency Medicine | Admitting: Emergency Medicine

## 2021-12-03 DIAGNOSIS — Z79899 Other long term (current) drug therapy: Secondary | ICD-10-CM | POA: Diagnosis not present

## 2021-12-03 DIAGNOSIS — K2970 Gastritis, unspecified, without bleeding: Secondary | ICD-10-CM | POA: Insufficient documentation

## 2021-12-03 DIAGNOSIS — R072 Precordial pain: Secondary | ICD-10-CM | POA: Diagnosis not present

## 2021-12-03 DIAGNOSIS — K2900 Acute gastritis without bleeding: Secondary | ICD-10-CM

## 2021-12-03 DIAGNOSIS — Z8616 Personal history of COVID-19: Secondary | ICD-10-CM | POA: Diagnosis not present

## 2021-12-03 DIAGNOSIS — R0789 Other chest pain: Secondary | ICD-10-CM | POA: Diagnosis present

## 2021-12-03 DIAGNOSIS — R079 Chest pain, unspecified: Secondary | ICD-10-CM

## 2021-12-03 LAB — D-DIMER, QUANTITATIVE: D-Dimer, Quant: <0.27 ug/mL-FEU (ref 0.00–0.50)

## 2021-12-03 LAB — CBC
HCT: 43.2 % (ref 36.0–46.0)
Hemoglobin: 14.2 g/dL (ref 12.0–15.0)
MCH: 31 pg (ref 26.0–34.0)
MCHC: 32.9 g/dL (ref 30.0–36.0)
MCV: 94.3 fL (ref 80.0–100.0)
Platelets: 243 10*3/uL (ref 150–400)
RBC: 4.58 MIL/uL (ref 3.87–5.11)
RDW: 12.8 % (ref 11.5–15.5)
WBC: 7.4 10*3/uL (ref 4.0–10.5)
nRBC: 0 % (ref 0.0–0.2)

## 2021-12-03 LAB — TROPONIN I (HIGH SENSITIVITY)
Troponin I (High Sensitivity): 5 ng/L (ref <18)
Troponin I (High Sensitivity): 6 ng/L (ref <18)

## 2021-12-03 LAB — BASIC METABOLIC PANEL
Anion gap: 7 (ref 5–15)
BUN: 14 mg/dL (ref 8–23)
CO2: 24 mmol/L (ref 22–32)
Calcium: 9.4 mg/dL (ref 8.9–10.3)
Chloride: 107 mmol/L (ref 98–111)
Creatinine, Ser: 0.83 mg/dL (ref 0.44–1.00)
GFR, Estimated: >60 mL/min (ref >60)
Glucose, Bld: 108 mg/dL — ABNORMAL HIGH (ref 70–99)
Potassium: 4.2 mmol/L (ref 3.5–5.1)
Sodium: 138 mmol/L (ref 135–145)

## 2021-12-03 MED ORDER — ALUM & MAG HYDROXIDE-SIMETH 200-200-20 MG/5ML PO SUSP
30.0000 mL | Freq: Once | ORAL | Status: AC
  Administered 2021-12-03: 30 mL via ORAL
  Filled 2021-12-03: qty 30

## 2021-12-03 MED ORDER — ALUM & MAG HYDROXIDE-SIMETH 400-400-40 MG/5ML PO SUSP: 5.0000 mL | Freq: Four times a day (QID) | ORAL | 0 refills | Status: AC | PRN

## 2021-12-03 NOTE — ED Provider Notes (Signed)
[facility] DEPT Provider Note   CSN: [id number] Arrival date & time: 12/03/21  [id number]     History  Chief Complaint  Patient presents with   Chest Pain    Jamie Ware is a 71 y.o. female.  HPI     Pt comes in with cc of chest pain. Reports hx of CP x 1 year, ever since she got covid. Chest pain is described as tightness with associated chest pounding. Discomfort is central and L sided. She has associated nausea, dib. Pt indicates that she had a treadmill stress test y'day, and the pain started after it (now for hours). Pain persistent since then, but improved.   Pulmonary dx hx: None.  Patient does indicate that she had COVID-19 last year.  GI dz hx: None, patient is on omeprazole.  Home Medications Prior to Admission medications   Medication Sig Start Date End Date Taking? Authorizing Provider  alum & mag hydroxide-simeth (MAALOX PLUS) 400-400-40 MG/5ML suspension Take 5 mLs by mouth every 6 (six) hours as needed for indigestion. 12/03/21  Yes Jovonta Levit, MD  atorvastatin (LIPITOR) 10 MG tablet Take 10 mg by mouth daily. 10/25/21  Yes [provider]  Calcium-Vitamin D-Vitamin K 650-12.5-40 MG-MCG-MCG CHEW Chew 1 tablet by mouth daily.   Yes [provider]  Cholecalciferol (VITAMIN D3) 50 MCG (2000 UT) TABS Take 2,000 tablets by mouth daily.   Yes [provider]  magnesium oxide (MAG-OX) 400 MG tablet Take 400 mg by mouth daily.   Yes [provider]  metoprolol succinate (TOPROL-XL) 25 MG 24 hr tablet Take 12.5 mg by mouth daily. 10/25/21  Yes [provider]  omeprazole (PRILOSEC) 20 MG capsule Take 20 mg by mouth 2 (two) times daily. 10/25/21  Yes [provider]      Allergies    Other, Penicillins, and Sertraline hcl    Review of Systems   Review of Systems  Constitutional:  Positive for activity change.  Respiratory:  Positive for shortness of breath.   Cardiovascular:  Positive  for chest pain.   Physical Exam Updated Vital Signs BP 137/80    Pulse (!) 50    Temp 97.7 F (36.5 C) (Oral)    Resp 15    Ht 5\' 1"  (1.549 m)    Wt 70.3 kg    SpO2 95%    BMI 29.29 kg/m  Physical Exam Vitals and nursing note reviewed.  Constitutional:      Appearance: She is well-developed.  HENT:     Head: Atraumatic.  Cardiovascular:     Rate and Rhythm: Normal rate.  Pulmonary:     Effort: Pulmonary effort is normal.  Musculoskeletal:     Cervical back: Normal range of motion and neck supple.     Right lower leg: No edema.     Left lower leg: No edema.  Skin:    General: Skin is warm and dry.  Neurological:     Mental Status: She is alert and oriented to person, place, and time.    ED Results / Procedures / Treatments   Labs (all labs ordered are listed, but only abnormal results are displayed) Labs Reviewed  BASIC METABOLIC PANEL - Abnormal; Notable for the following components:      Result Value   Glucose, Bld 108 (*)    All other components within normal limits  CBC  D-DIMER, QUANTITATIVE  TROPONIN I (HIGH SENSITIVITY)  TROPONIN I (HIGH SENSITIVITY)    EKG EKG Interpretation  Date/Time:  Saturday December 03 2021 01:34:32 EST Ventricular Rate:  60 PR Interval:  197 QRS Duration: 107 QT Interval:  434 QTC Calculation: 434 R Axis:   -16 Text Interpretation: Sinus rhythm Borderline left axis deviation Low voltage, precordial leads No acute changes No significant change since last tracing Confirmed by Varney Biles [id number]) on 12/03/2021 2:05:05 AM  Radiology DG Chest Port 1 View  Result Date: 12/03/2021 CLINICAL DATA:  Epigastric pain left-sided chest pain. EXAM: PORTABLE CHEST 1 VIEW COMPARISON:  May 29, 2016 FINDINGS: The heart size and mediastinal contours are within normal limits. Both lungs are clear. The visualized skeletal structures are unremarkable. IMPRESSION: No active disease. Electronically Signed   By: Virgina Norfolk M.D.   On: 12/03/2021  03:38    Procedures Procedures    Medications Ordered in ED Medications  alum & mag hydroxide-simeth (MAALOX/MYLANTA) 200-200-20 MG/5ML suspension 30 mL (30 mLs Oral Given 12/03/21 0240)    ED Course/ Medical Decision Making/ A&P Clinical Course as of 12/03/21 0733  Sat Dec 03, 2021  0733 Patient indicated that her chest pain resolved with Mylanta.  Troponin to 2 negative. D-dimer was ordered, which is also negative.  The patient appears reasonably screened and/or stabilized for discharge and I doubt any other medical condition or other [facility] requiring further screening, evaluation, or treatment in the ED at this time prior to discharge.   Results from the ER workup discussed with the patient face to face and all questions answered to the best of my ability. The patient is safe for discharge with strict return precautions. [AN]    Clinical Course User Index [AN] Varney Biles, MD                           Medical Decision Making Number and complexity of problems evaluated during this encounter: - Moderate: 1 undiagnosed new problem with uncertain prognosis  Amount and/or Complexity of Data: - Unique tests ordered including labs and imaging: > 3 - Test reviewed (review of imaging, EKGs): EKG shows sinus bradycardia without no evidence of acute ischemia -Reviewed external notes, specifically patient had treadmill stress test yesterday which did not reveal any evidence of ischemia.  It was slightly suboptimal at 81% max capacity, but overall looked normal.  Risk of Morbidity, Mortality, or Complications - Risk level: Moderate -chest pain in a 71 year old woman with a heart score of 4.  Considered admission, but she does have some atypical features to her chest pain, and she just had a negative stress test yesterday.  If the troponins are negative with this persistent pain, chances of myocardial ischemia is quite low.  Problems Addressed: Acute gastritis without hemorrhage,  unspecified gastritis type: undiagnosed new problem with uncertain prognosis Chest pain: undiagnosed new problem with uncertain prognosis Precordial chest pain: undiagnosed new problem with uncertain prognosis  Amount and/or Complexity of Data Reviewed External Data Reviewed: notes. Labs: ordered. Radiology: ordered. ECG/medicine tests: ordered.  Risk OTC drugs. Prescription drug management.    71 year old female comes in with chief complaint of chest pain.  She has history of hypertension, hyperlipidemia and allegedly just had a stress test yesterday.  Reports that after the stress test she has been having some midsternal and left-sided chest pain.  There is some associated shortness of breath, and the pain is worse with breathing.  She feels short of breath.  She had COVID-19 last year.  No history of blood clots in the legs or lungs.  No URI-like symptoms right now.  Differential diagnosis includes: ACS syndrome Aortic dissection CHF exacerbation Valvular disorder Myocarditis Pericarditis Endocarditis Pericardial effusion / tamponade Pneumonia Pleural effusion / Pulmonary edema PE Pneumothorax Musculoskeletal pain PUD / Gastritis / Esophagitis Esophageal spasm    Final Clinical Impression(s) / ED Diagnoses Final diagnoses:  Precordial chest pain  Acute gastritis without hemorrhage, unspecified gastritis type    Rx / DC Orders ED Discharge Orders          Ordered    alum & mag hydroxide-simeth (MAALOX PLUS) 400-400-40 MG/5ML suspension  Every 6 hours PRN        12/03/21 0718              Varney Biles, MD 12/03/21 [phone]

## 2021-12-03 NOTE — ED Notes (Addendum)
Interpreter Won # [phone] used for this discharge.  Pt discharged. Instructions and prescription given. AAOX4. Pt in no apparent distress or pain. The opportunity to ask questions was provided.

## 2021-12-03 NOTE — ED Triage Notes (Signed)
Interpreter used for triage.   Pt states yesterday she had developed epigastric/ left chest pain/ Describes a "fullness of stomach and chest, restlessness and sense of doom".

## 2021-12-03 NOTE — Discharge Instructions (Signed)
We saw you in the ER for the chest pain/shortness of breath. All of our cardiac workup is normal, including labs, EKG and chest X-RAY are normal.  We screened you for blood clots in the lung, that test was also negative  We are not sure what is causing your discomfort, but it seems like medications to reduce stomach acid helped with your pain.  We are prescribing you medication to alleviate acid reflux.  Additionally, your heart rate has been in the 50s while in the ER.  Please follow-up with your primary care doctor in 1 week for further assessment of the heart rate.  Return to the ER if you start having dizziness or near fainting spells. Please return to the ER if you have worsening chest pain, shortness of breath, pain radiating to your jaw, shoulder, or back, sweats or fainting.

## 2022-03-16 ENCOUNTER — Emergency Department (HOSPITAL_COMMUNITY)
Admission: EM | Admit: 2022-03-16 | Discharge: 2022-03-16 | Disposition: A | Discharge status: Home / Self Care | Payer: Medicare Other | Attending: Emergency Medicine | Admitting: Emergency Medicine

## 2022-03-16 ENCOUNTER — Encounter (HOSPITAL_COMMUNITY): Payer: Self-pay

## 2022-03-16 ENCOUNTER — Other Ambulatory Visit: Payer: Self-pay

## 2022-03-16 DIAGNOSIS — S60931A Unspecified superficial injury of right thumb, initial encounter: Secondary | ICD-10-CM | POA: Diagnosis present

## 2022-03-16 DIAGNOSIS — S61011A Laceration without foreign body of right thumb without damage to nail, initial encounter: Secondary | ICD-10-CM | POA: Insufficient documentation

## 2022-03-16 DIAGNOSIS — W260XXA Contact with knife, initial encounter: Secondary | ICD-10-CM | POA: Insufficient documentation

## 2022-03-16 DIAGNOSIS — Z23 Encounter for immunization: Secondary | ICD-10-CM | POA: Insufficient documentation

## 2022-03-16 MED ORDER — TETANUS-DIPHTH-ACELL PERTUSSIS 5-2.5-18.5 LF-MCG/0.5 IM SUSY
0.5000 mL | PREFILLED_SYRINGE | Freq: Once | INTRAMUSCULAR | Status: AC
  Administered 2022-03-16: 0.5 mL via INTRAMUSCULAR
  Filled 2022-03-16: qty 0.5

## 2022-03-16 NOTE — ED Triage Notes (Signed)
Patient states she cut her right thumb while cutting pumpkin today. ?

## 2022-03-16 NOTE — Discharge Instructions (Signed)
Like we discussed, please apply fresh bandage to your finger once per day.  Please keep the wound clean and dry.  I would recommend applying antibiotic ointment to the wound as well.  I recommend triple antibiotic or bacitracin. ? ?Please continue to monitor your symptoms closely.  If you develop any new or worsening symptoms or uncontrolled bleeding please come back to the emergency department immediately for reevaluation. ? ?################### ? ???? ?? ?? ??? ? ? ???? ? ??? ?????. ??? ???? ???? ??????. ???? ??? ??? ??? ?? ????. ?????? ?????? ?????. ? ????? ??? ??? ????????. ???? ???? ?? ?? ???? ?? ??? ???? ?? ???? ??? ???? ?????. ?non-uihan bawa gat-i halue han beon songalag-e sae bungdaeleul gam-eusibsio. sangcheoleul kkaekkeushago geonjohage yujihasibsio. sangcheoedo hangsaengje yeongoleul baleuneun geos-i johseubnida. samjunghangsaengjena basiteulasin-eul chucheonhabnida. ? ?gyesoghaeseo jeungsang-eul myeonmilhi moniteolinghasibsio. saelobgeona aghwadoeneun jeungsang ttoneun jojeoldoeji anhneun chulhyeol-i natanamyeon jeugsi eung-geubsillo dol-awa jaepyeong-galeul bad-eusibsio. ?

## 2022-03-16 NOTE — ED Provider Notes (Signed)
[facility]Provider Note ? ? ?CSN: [id number] ?Arrival date & time: 03/16/22  1654 ? ?  ? ?History ? ?No chief complaint on file. ? ? ?Jamie Ware is a 71 y.o. female. ? ?HPI ?Patient is a 71 year old female who presents to the emergency department due to a laceration of the right thumb that occurred around 4 PM.  History obtained via [place] interpreter.  Patient states that she was using a sharp knife to cut a pumpkin which slipped and cut the end of the right thumb.  She is right-hand dominant.  Reports moderate bleeding and pain at the site.  No numbness or weakness.  No other complaints.  States she is unsure of the timing of her last Tdap. ?  ? ?Home Medications ?Prior to Admission medications   ?Medication Sig Start Date End Date Taking? Authorizing Provider  ?alum & mag hydroxide-simeth (MAALOX PLUS) 400-400-40 MG/5ML suspension Take 5 mLs by mouth every 6 (six) hours as needed for indigestion. 12/03/21   Derwood Kaplan, MD  ?atorvastatin (LIPITOR) 10 MG tablet Take 10 mg by mouth daily. 10/25/21   [provider]  ?Calcium-Vitamin D-Vitamin K 650-12.5-40 MG-MCG-MCG CHEW Chew 1 tablet by mouth daily.    [provider]  ?Cholecalciferol (VITAMIN D3) 50 MCG (2000 UT) TABS Take 2,000 tablets by mouth daily.    [provider]  ?magnesium oxide (MAG-OX) 400 MG tablet Take 400 mg by mouth daily.    [provider]  ?metoprolol succinate (TOPROL-XL) 25 MG 24 hr tablet Take 12.5 mg by mouth daily. 10/25/21   [provider]  ?omeprazole (PRILOSEC) 20 MG capsule Take 20 mg by mouth 2 (two) times daily. 10/25/21   [provider]  ?   ? ?Allergies    ?Other, Penicillins, and Sertraline hcl   ? ?Review of Systems   ?Review of Systems  ?Skin:  Positive for wound. Negative for color change.  ?Neurological:  Negative for weakness and numbness.  ? ?Physical Exam ?Updated Vital Signs ?BP (!) 172/104 (BP Location: Left Arm)   Pulse 84    Temp 98 ?F (36.7 ?C) (Oral)   Resp 18   Ht 5' 1.02" (1.55 m)   Wt 72.6 kg   SpO2 97%   BMI 30.21 kg/m?  ?Physical Exam ?Vitals and nursing note reviewed.  ?Constitutional:   ?   General: She is not in acute distress. ?   Appearance: She is well-developed.  ?HENT:  ?   Head: Normocephalic and atraumatic.  ?   Right Ear: External ear normal.  ?   Left Ear: External ear normal.  ?Eyes:  ?   General: No scleral icterus.    ?   Right eye: No discharge.     ?   Left eye: No discharge.  ?   Conjunctiva/sclera: Conjunctivae normal.  ?Neck:  ?   Trachea: No tracheal deviation.  ?Cardiovascular:  ?   Rate and Rhythm: Normal rate.  ?Pulmonary:  ?   Effort: Pulmonary effort is normal. No respiratory distress.  ?   Breath sounds: No stridor.  ?Abdominal:  ?   General: There is no distension.  ?Musculoskeletal:     ?   General: No swelling or deformity.  ?   Cervical back: Neck supple.  ?Skin: ?   General: Skin is warm and dry.  ?   Findings: No rash.  ?   Comments: 0.5 cm laceration noted to the distal end of the right thumb along the  radial aspect that appears to have amputated the affected tissue.  Mild bleeding noted in the region that improves with direct pressure.  Full range of motion of the finger.  Good cap refill.  Distal sensation intact.  2+ radial pulse.  No involvement noted of the nailbed.  ?Neurological:  ?   Mental Status: She is alert.  ?   Cranial Nerves: Cranial nerve deficit: no gross deficits.  ? ?ED Results / Procedures / Treatments   ?Labs ?(all labs ordered are listed, but only abnormal results are displayed) ?Labs Reviewed - No data to display ? ?EKG ?None ? ?Radiology ?No results found. ? ?Procedures ?Procedures  ? ?Medications Ordered in ED ?Medications  ?Tdap (BOOSTRIX) injection 0.5 mL (has no administration in time range)  ? ? ?ED Course/ Medical Decision Making/ A&P ?  ?                        ?Medical Decision Making ?Risk ?Prescription drug management. ? ?Patient is a 71 year old female who  presents to the emergency department due to what appears to be an amputation to the distal end of the right thumb.  Patient was cutting a pumpkin and the knife slipped and cut the affected region. ? ?On my exam patient has a 0.5 cm region along the distal radial aspect of the right thumb that appears to have been amputated.  No visible bony involvement.  Full active and passive range of motion of the finger.  Neurovascularly intact.  No nailbed involvement.  Does not appear to be amenable to sutures.  Mild bleeding at the site that improves with direct pressure. ? ?Wound was cleaned by nursing staff and wrapped in Xeroform as well as Coban.  Patient tolerated this well.  Tdap was updated in the ED.  Discussed wound care in length.  Patient given supplies for wound care as well. ? ?Patient appears stable for discharge at this time and she is agreeable.  We discussed return precautions.  Her questions were answered and she was amicable at the time of discharge. ?Final Clinical Impression(s) / ED Diagnoses ?Final diagnoses:  ?Laceration of right thumb without foreign body without damage to nail, initial encounter  ? ?Rx / DC Orders ?ED Discharge Orders   ? ? None  ? ?  ? ?  ?Placido Sou, PA-C ?03/16/22 1801 ? ?  ?Charlynne Pander, MD ?03/16/22 2308 ? ?

## 2022-07-07 ENCOUNTER — Emergency Department (HOSPITAL_COMMUNITY): Payer: Medicare Other

## 2022-07-07 ENCOUNTER — Emergency Department (HOSPITAL_COMMUNITY)
Admission: EM | Admit: 2022-07-07 | Discharge: 2022-07-07 | Discharge status: Against Medical Advice | Payer: Medicare Other | Attending: Emergency Medicine | Admitting: Emergency Medicine

## 2022-07-07 ENCOUNTER — Encounter (HOSPITAL_COMMUNITY): Payer: Self-pay | Admitting: Emergency Medicine

## 2022-07-07 ENCOUNTER — Other Ambulatory Visit: Payer: Self-pay

## 2022-07-07 DIAGNOSIS — R002 Palpitations: Secondary | ICD-10-CM | POA: Diagnosis not present

## 2022-07-07 DIAGNOSIS — R110 Nausea: Secondary | ICD-10-CM | POA: Diagnosis not present

## 2022-07-07 DIAGNOSIS — Z79899 Other long term (current) drug therapy: Secondary | ICD-10-CM | POA: Diagnosis not present

## 2022-07-07 DIAGNOSIS — R0789 Other chest pain: Secondary | ICD-10-CM | POA: Insufficient documentation

## 2022-07-07 DIAGNOSIS — I10 Essential (primary) hypertension: Secondary | ICD-10-CM | POA: Diagnosis not present

## 2022-07-07 DIAGNOSIS — R1013 Epigastric pain: Secondary | ICD-10-CM | POA: Insufficient documentation

## 2022-07-07 DIAGNOSIS — Z5321 Procedure and treatment not carried out due to patient leaving prior to being seen by health care provider: Secondary | ICD-10-CM

## 2022-07-07 DIAGNOSIS — R079 Chest pain, unspecified: Secondary | ICD-10-CM | POA: Diagnosis present

## 2022-07-07 LAB — BASIC METABOLIC PANEL
Anion gap: 8 (ref 5–15)
BUN: 17 mg/dL (ref 8–23)
CO2: 24 mmol/L (ref 22–32)
Calcium: 9.7 mg/dL (ref 8.9–10.3)
Chloride: 109 mmol/L (ref 98–111)
Creatinine, Ser: 0.72 mg/dL (ref 0.44–1.00)
GFR, Estimated: >60 mL/min (ref >60)
Glucose, Bld: 112 mg/dL — ABNORMAL HIGH (ref 70–99)
Potassium: 4.2 mmol/L (ref 3.5–5.1)
Sodium: 141 mmol/L (ref 135–145)

## 2022-07-07 LAB — CBC
HCT: 42.7 % (ref 36.0–46.0)
Hemoglobin: 14.1 g/dL (ref 12.0–15.0)
MCH: 30.9 pg (ref 26.0–34.0)
MCHC: 33 g/dL (ref 30.0–36.0)
MCV: 93.4 fL (ref 80.0–100.0)
Platelets: 245 10*3/uL (ref 150–400)
RBC: 4.57 MIL/uL (ref 3.87–5.11)
RDW: 12.7 % (ref 11.5–15.5)
WBC: 5.9 10*3/uL (ref 4.0–10.5)
nRBC: 0 % (ref 0.0–0.2)

## 2022-07-07 LAB — TROPONIN I (HIGH SENSITIVITY)
Troponin I (High Sensitivity): 4 ng/L (ref <18)
Troponin I (High Sensitivity): 4 ng/L (ref <18)

## 2022-07-07 LAB — LIPASE, BLOOD: Lipase: 36 U/L (ref 11–51)

## 2022-07-07 MED ORDER — LIDOCAINE VISCOUS HCL 2 % MT SOLN
15.0000 mL | Freq: Once | OROMUCOSAL | Status: AC
  Administered 2022-07-07: 15 mL via ORAL
  Filled 2022-07-07: qty 15

## 2022-07-07 MED ORDER — ALUM & MAG HYDROXIDE-SIMETH 200-200-20 MG/5ML PO SUSP
30.0000 mL | Freq: Once | ORAL | Status: AC
  Administered 2022-07-07: 30 mL via ORAL
  Filled 2022-07-07: qty 30

## 2022-07-07 NOTE — ED Triage Notes (Signed)
Pt reports chest pain that began yesterday. Pt reports central chest pain that feels heavy. Denies N/V.
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
## 2022-07-07 NOTE — ED Provider Triage Note (Signed)
Emergency Medicine Provider Triage Evaluation Note  Jamie Ware , a 71 y.o. female  was evaluated in triage.  Pt complains of chest pain. Intermittent central chest pain x 2 weeks with associated nausea, lightheadedness, heart palpitation.  No fever, sob, cough.  Report being seen in the ER for this in the past with improvement after IVF.  Review of Systems  Positive: As above Negative: As above  Physical Exam  BP (!) 148/101 (BP Location: Right Arm)   Pulse 81   Temp 98 F (36.7 C) (Oral)   Resp 18   SpO2 99%  Gen:   Awake, no distress   Resp:  Normal effort  MSK:   Moves extremities without difficulty  Other:    Medical Decision Making  Medically screening exam initiated at 12:00 PM.  Appropriate orders placed.  Jamie Ware was informed that the remainder of the evaluation will be completed by another provider, this initial triage assessment does not replace that evaluation, and the importance of remaining in the ED until their evaluation is complete.     Fayrene Helper, PA-C 07/07/22 1210

## 2022-07-30 ENCOUNTER — Encounter (HOSPITAL_COMMUNITY): Payer: Self-pay

## 2022-07-30 ENCOUNTER — Emergency Department (HOSPITAL_COMMUNITY)
Admission: EM | Admit: 2022-07-30 | Discharge: 2022-07-31 | Disposition: A | Discharge status: Home / Self Care | Payer: Medicare Other | Attending: Emergency Medicine | Admitting: Emergency Medicine

## 2022-07-30 ENCOUNTER — Emergency Department (HOSPITAL_COMMUNITY): Payer: Medicare Other

## 2022-07-30 DIAGNOSIS — R0602 Shortness of breath: Secondary | ICD-10-CM | POA: Insufficient documentation

## 2022-07-30 DIAGNOSIS — I10 Essential (primary) hypertension: Secondary | ICD-10-CM | POA: Insufficient documentation

## 2022-07-30 DIAGNOSIS — R0789 Other chest pain: Secondary | ICD-10-CM | POA: Insufficient documentation

## 2022-07-30 DIAGNOSIS — U099 Post covid-19 condition, unspecified: Secondary | ICD-10-CM | POA: Diagnosis not present

## 2022-07-30 DIAGNOSIS — R079 Chest pain, unspecified: Secondary | ICD-10-CM | POA: Diagnosis present

## 2022-07-30 DIAGNOSIS — R531 Weakness: Secondary | ICD-10-CM | POA: Insufficient documentation

## 2022-07-30 DIAGNOSIS — R002 Palpitations: Secondary | ICD-10-CM | POA: Insufficient documentation

## 2022-07-30 DIAGNOSIS — Z79899 Other long term (current) drug therapy: Secondary | ICD-10-CM | POA: Diagnosis not present

## 2022-07-30 LAB — CBC WITH DIFFERENTIAL/PLATELET
Abs Immature Granulocytes: 0.01 10*3/uL (ref 0.00–0.07)
Basophils Absolute: 0 10*3/uL (ref 0.0–0.1)
Basophils Relative: 0 %
Eosinophils Absolute: 0.1 10*3/uL (ref 0.0–0.5)
Eosinophils Relative: 2 %
HCT: 42.9 % (ref 36.0–46.0)
Hemoglobin: 14 g/dL (ref 12.0–15.0)
Immature Granulocytes: 0 %
Lymphocytes Relative: 36 %
Lymphs Abs: 2.6 10*3/uL (ref 0.7–4.0)
MCH: 30.9 pg (ref 26.0–34.0)
MCHC: 32.6 g/dL (ref 30.0–36.0)
MCV: 94.7 fL (ref 80.0–100.0)
Monocytes Absolute: 0.5 10*3/uL (ref 0.1–1.0)
Monocytes Relative: 7 %
Neutro Abs: 4 10*3/uL (ref 1.7–7.7)
Neutrophils Relative %: 55 %
Platelets: 254 10*3/uL (ref 150–400)
RBC: 4.53 MIL/uL (ref 3.87–5.11)
RDW: 12.9 % (ref 11.5–15.5)
WBC: 7.2 10*3/uL (ref 4.0–10.5)
nRBC: 0 % (ref 0.0–0.2)

## 2022-07-30 NOTE — ED Triage Notes (Signed)
Ambulatory to ED with c/o chest pain and hypertension. States the pain has been going on every day for days but today her BP was 150s/100s at home.
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
## 2022-07-31 DIAGNOSIS — R0789 Other chest pain: Secondary | ICD-10-CM | POA: Diagnosis not present

## 2022-07-31 LAB — URINALYSIS, ROUTINE W REFLEX MICROSCOPIC
Bacteria, UA: NONE SEEN
Bilirubin Urine: NEGATIVE
Glucose, UA: NEGATIVE mg/dL
Hgb urine dipstick: NEGATIVE
Ketones, ur: NEGATIVE mg/dL
Nitrite: NEGATIVE
Protein, ur: NEGATIVE mg/dL
Specific Gravity, Urine: 1.005 (ref 1.005–1.030)
pH: 7 (ref 5.0–8.0)

## 2022-07-31 LAB — BASIC METABOLIC PANEL
Anion gap: 7 (ref 5–15)
BUN: 18 mg/dL (ref 8–23)
CO2: 24 mmol/L (ref 22–32)
Calcium: 9.7 mg/dL (ref 8.9–10.3)
Chloride: 108 mmol/L (ref 98–111)
Creatinine, Ser: 0.7 mg/dL (ref 0.44–1.00)
GFR, Estimated: >60 mL/min (ref >60)
Glucose, Bld: 162 mg/dL — ABNORMAL HIGH (ref 70–99)
Potassium: 3.8 mmol/L (ref 3.5–5.1)
Sodium: 139 mmol/L (ref 135–145)

## 2022-07-31 LAB — TROPONIN I (HIGH SENSITIVITY)
Troponin I (High Sensitivity): 5 ng/L (ref <18)
Troponin I (High Sensitivity): 5 ng/L (ref <18)

## 2022-07-31 MED ORDER — OXYMETAZOLINE HCL 0.05 % NA SOLN
2.0000 | Freq: Two times a day (BID) | NASAL | Status: DC | PRN
  Administered 2022-07-31: 2 via NASAL
  Filled 2022-07-31: qty 30

## 2022-07-31 MED ORDER — SODIUM CHLORIDE 0.9 % IV BOLUS
500.0000 mL | Freq: Once | INTRAVENOUS | Status: AC
  Administered 2022-07-31: 500 mL via INTRAVENOUS

## 2023-05-12 IMAGING — DX DG CHEST 1V PORT
1 series · 1 of 1 positions shown · non-contrast
Comparison: May 29, 2016

CLINICAL DATA: Epigastric pain left-sided chest pain.

EXAM:
PORTABLE CHEST 1 VIEW

[chest ap]
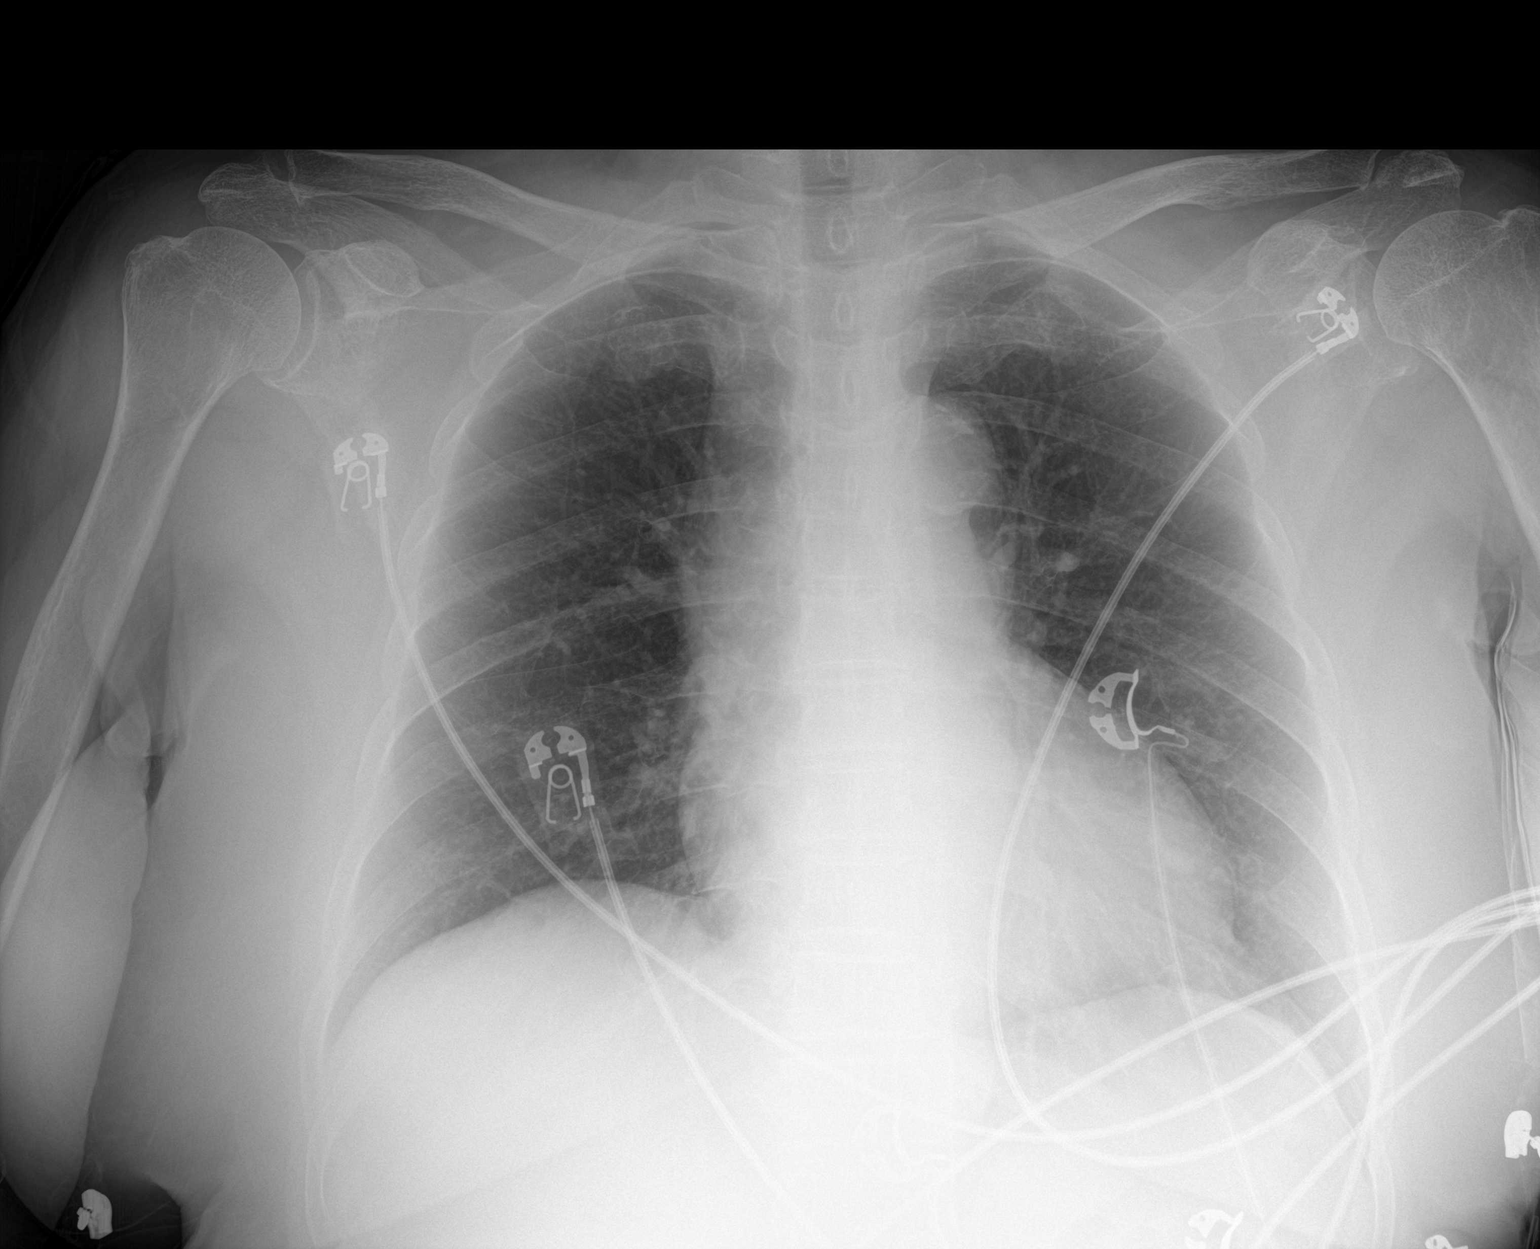

[1 of 1 positions shown; findings below may reference images not displayed]

FINDINGS: The heart size and mediastinal contours are within normal limits.
Both lungs are clear. The visualized skeletal structures are
unremarkable.
IMPRESSION: No active disease.
